# Patient Record
Sex: Female | Born: 2000 | Race: White | Hispanic: No | Marital: Single | Smoking: Never smoker
Health system: Southern US, Community
[De-identification: ages and names within clinical notes are randomized; demographics above are authoritative.]

## PROBLEM LIST (undated history)

## (undated) DIAGNOSIS — F419 Anxiety disorder, unspecified: Secondary | ICD-10-CM

## (undated) DIAGNOSIS — F909 Attention-deficit hyperactivity disorder, unspecified type: Secondary | ICD-10-CM

## (undated) HISTORY — DX: Attention-deficit hyperactivity disorder, unspecified type: F90.9

## (undated) HISTORY — PX: WISDOM TOOTH EXTRACTION: SHX21

## (undated) HISTORY — DX: Anxiety disorder, unspecified: F41.9

## (undated) HISTORY — PX: TONSILLECTOMY: SHX5217

## (undated) HISTORY — PX: NOSE SURGERY: SHX723

---
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
## 2022-05-11 ENCOUNTER — Encounter: Payer: Self-pay | Admitting: Emergency Medicine

## 2022-05-11 ENCOUNTER — Ambulatory Visit
Admission: EM | Admit: 2022-05-11 | Discharge: 2022-05-11 | Disposition: A | Discharge status: Home / Self Care | Payer: 59

## 2022-05-11 DIAGNOSIS — J020 Streptococcal pharyngitis: Secondary | ICD-10-CM

## 2022-05-11 LAB — POCT RAPID STREP A (OFFICE): Rapid Strep A Screen: POSITIVE — AB

## 2022-05-11 MED ORDER — SULFAMETHOXAZOLE-TRIMETHOPRIM 800-160 MG PO TABS: 1.0000 | ORAL_TABLET | Freq: Two times a day (BID) | ORAL | 0 refills | Status: AC

## 2022-05-11 NOTE — Discharge Instructions (Signed)
Strep test is positive for bacteria in the throat  Begin use of Bactrim every morning and every evening for 10 days  May attempt salt water gargles, throat lozenges, warm water plain liquids to preference, softer foods, Tylenol and ibuprofen as needed for pain  Please notify your ear nose and throat doctor of positive test  May follow-up with urgent care as needed

## 2022-05-11 NOTE — ED Triage Notes (Signed)
Patient c/o sore throat and nausea x 1 day.   Patient denies fever at home.   Patient endorses painful swallowing. Patient endorses chills .   Patient endorses issues with reoccurrence of  strep with last diagnosis being earlier this year per patient statement.

## 2022-05-11 NOTE — ED Provider Notes (Signed)
[name]    CSN: [id number] Arrival date & time: 05/11/22  1507      History   Chief Complaint Chief Complaint  Patient presents with   Sore Throat   Nausea    HPI Latoya Rivera is a 21 y.o. female.   Patient presents with sore throat, nausea without vomiting and chills beginning 1 day ago.  No known sick contacts but endorses COVID has been going around her school.  Has not attempted to eat or drink today.  Has not attempted treatment of symptoms.  Endorses reoccurring strep followed by ear nose and throat.  Denies nasal congestion, ear pain, coughing, shortness of breath, wheezing, abdominal pain.  History reviewed. No pertinent past medical history.  There are no problems to display for this patient.   Past Surgical History:  Procedure Laterality Date   NOSE SURGERY     WISDOM TOOTH EXTRACTION Bilateral     OB History   No obstetric history on file.      Home Medications    Prior to Admission medications   Medication Sig Start Date End Date Taking? Authorizing Provider  escitalopram (LEXAPRO) 10 MG tablet Take 10 mg by mouth daily. 05/10/22  Yes [provider]  sulfamethoxazole-trimethoprim (BACTRIM DS) 800-160 MG tablet Take 1 tablet by mouth 2 (two) times daily for 10 days. 05/11/22 05/21/22 Yes Makynzi Eastland, Elita Boone, NP  levonorgestrel (KYLEENA) 19.5 MG IUD 1 each by Intrauterine route once.    [provider]  omeprazole (PRILOSEC OTC) 20 MG tablet Take 1 tablet (20 mg total) by mouth daily. 07/03/21 08/02/21  Georga Hacking, MD  sertraline (ZOLOFT) 50 MG tablet Take 1 tablet by mouth daily. 04/13/21 05/13/21  [provider]  triamcinolone (KENALOG) 0.025 % ointment Apply 1 application topically 2 (two) times daily. 05/01/21   Bing Neighbors, FNP    Family History History reviewed. No pertinent family history.  Social History Social History   Tobacco Use   Smoking status: Never   Smokeless tobacco: Never   Substance Use Topics   Alcohol use: Never   Drug use: Never     Allergies   Other, Meningococcal grp b (recomb) vaccine, and Penicillins   Review of Systems Review of Systems Defer to HPI   Physical Exam Triage Vital Signs ED Triage Vitals  Enc Vitals Group     BP 05/11/22 1521 99/63     Pulse Rate 05/11/22 1521 100     Resp 05/11/22 1521 16     Temp 05/11/22 1521 99.3 F (37.4 C)     Temp Source 05/11/22 1521 Oral     SpO2 05/11/22 1521 98 %     Weight --      Height --      Head Circumference --      Peak Flow --      Pain Score 05/11/22 1518 7     Pain Loc --      Pain Edu? --      Excl. in GC? --    No data found.  Updated Vital Signs BP 99/63 (BP Location: Left Arm)   Pulse 100   Temp 99.3 F (37.4 C) (Oral)   Resp 16   LMP  (LMP Unknown)   SpO2 98%   Visual Acuity Right Eye Distance:   Left Eye Distance:   Bilateral Distance:    Right Eye Near:   Left Eye Near:    Bilateral Near:     Physical  Exam Constitutional:      Appearance: She is well-developed.  HENT:     Head: Normocephalic.     Right Ear: Tympanic membrane and ear canal normal.     Left Ear: Tympanic membrane and ear canal normal.     Nose: No congestion or rhinorrhea.     Mouth/Throat:     Mouth: Mucous membranes are moist.     Pharynx: No posterior oropharyngeal erythema.     Tonsils: No tonsillar exudate. 2+ on the right. 2+ on the left.  Cardiovascular:     Rate and Rhythm: Normal rate and regular rhythm.     Heart sounds: Normal heart sounds.  Pulmonary:     Effort: Pulmonary effort is normal.     Breath sounds: Normal breath sounds.  Musculoskeletal:     Cervical back: Normal range of motion and neck supple.  Skin:    General: Skin is warm and dry.  Neurological:     General: No focal deficit present.     Mental Status: She is alert and oriented to person, place, and time.  Psychiatric:        Mood and Affect: Mood normal.      UC Treatments / Results   Labs (all labs ordered are listed, but only abnormal results are displayed) Labs Reviewed  POCT RAPID STREP A (OFFICE) - Abnormal; Notable for the following components:      Result Value   Rapid Strep A Screen Positive (*)    All other components within normal limits    EKG   Radiology No results found.  Procedures Procedures (including critical care time)  Medications Ordered in UC Medications - No data to display  Initial Impression / Assessment and Plan / UC Course  I have reviewed the triage vital signs and the nursing notes.  Pertinent labs & imaging results that were available during my care of the patient were reviewed by me and considered in my medical decision making (see chart for details).  Strep pharyngitis  Confirmed by PCR, Bactrim 10-day course prescribed, allergy to penicillin confirmed, recommended supportive care for additional advised patient to notify ENT test results as she endorsed the next step is a tonsillectomy, may follow-up with urgent care as needed Final Clinical Impressions(s) / UC Diagnoses   Final diagnoses:  Strep pharyngitis     Discharge Instructions      Strep test is positive for bacteria in the throat  Begin use of Bactrim every morning and every evening for 10 days  May attempt salt water gargles, throat lozenges, warm water plain liquids to preference, softer foods, Tylenol and ibuprofen as needed for pain  Please notify your ear nose and throat doctor of positive test  May follow-up with urgent care as needed   ED Prescriptions     Medication Sig Dispense Auth. Provider   sulfamethoxazole-trimethoprim (BACTRIM DS) 800-160 MG tablet Take 1 tablet by mouth 2 (two) times daily for 10 days. 20 tablet Valinda Hoar, NP      PDMP not reviewed this encounter.   Valinda Hoar, NP 05/11/22 1538

## 2022-06-03 ENCOUNTER — Encounter: Payer: Self-pay | Admitting: Internal Medicine

## 2022-06-03 ENCOUNTER — Other Ambulatory Visit: Payer: Self-pay

## 2022-06-03 ENCOUNTER — Ambulatory Visit (INDEPENDENT_AMBULATORY_CARE_PROVIDER_SITE_OTHER): Payer: 59 | Admitting: Internal Medicine

## 2022-06-03 VITALS — BP 108/64 | HR 74 | Temp 97.7°F | Resp 18 | Ht 65.0 in | Wt 138.6 lb

## 2022-06-03 DIAGNOSIS — R140 Abdominal distension (gaseous): Secondary | ICD-10-CM

## 2022-06-03 DIAGNOSIS — Z880 Allergy status to penicillin: Secondary | ICD-10-CM

## 2022-06-03 DIAGNOSIS — R1084 Generalized abdominal pain: Secondary | ICD-10-CM | POA: Diagnosis not present

## 2022-06-03 DIAGNOSIS — J310 Chronic rhinitis: Secondary | ICD-10-CM | POA: Diagnosis not present

## 2022-06-03 DIAGNOSIS — R1013 Epigastric pain: Secondary | ICD-10-CM | POA: Diagnosis not present

## 2022-06-03 MED ORDER — FLUTICASONE PROPIONATE 50 MCG/ACT NA SUSP: 2.0000 | Freq: Every day | NASAL | 5 refills | Status: DC

## 2022-06-03 NOTE — Progress Notes (Signed)
NEW PATIENT  Date of Service/Encounter:  06/03/22  Consult requested by: Patient, No Pcp Per   Subjective:   Latoya Rivera (DOB: 01-06-2001) is a 21 y.o. female who presents to the clinic on 06/03/2022 with a chief complaint of Food Intolerance and Allergy Testing .    History obtained from: chart review and patient.  Concern for food allergy: Reports over the past 1 to 1-1/2-year, she has had abdominal pain, bloating, nausea.  Denies weight loss or vomiting or diarrhea.  She has been followed by GI, Lethea Killings NP.  They are considering IBS, gastritis, biliary causes as differential diagnosis.  She has undergone testing for celiac and h pylori through blood work and it was negative.  She has never undergone EGD or colonoscopy.  She has noted that she is sensitive to gluten and tries to avoid it as much as possible.  She has also removed dairy for about 6 weeks and replaced it with almond and oat milk without any improvement.  She does try to avoid eating fatty and sugary foods but it is difficult while being in college.  She eats chicken, salads, fruits, vegetables, seafood, quesadillas, chicken nuggets etc. She also does drink alcohol on the weekends mostly fruity mixed drinks but avoids beer.  Rhinitis:  Started since childhood Symptoms include: nasal congestion, rhinorrhea, post nasal drainage, and sneezing  Occurs seasonally-Spring and Fall Potential triggers: pollen Treatments tried: Zyrtec or Claritin PRN. She has tried nose sprays in the past but can't recall the names.  These do help. Previous allergy testing: no History of chronic sinusitis or sinus surgery: yes; septoplasty.  About to undergo tonsillectomy and adenoidectomy next week and then is going to go home to [place]   Concern for Drug Allergy: Drug of concern: Amoxicillin in childhood History of reaction: hives only, does not recall the exact history Previous testing: none Carries an epinephrine  autoinjector: no  Past Medical History: History reviewed. No pertinent past medical history.  Past Surgical History: Past Surgical History:  Procedure Laterality Date   NOSE SURGERY     WISDOM TOOTH EXTRACTION Bilateral     Family History: History reviewed. No pertinent family history. ADHD Anxiety Depression  Social History:  Lives in a unknown year apartment [place] in bedroom: wood Pets: dog Tobacco use/exposure: yes vapes and smokes cigarettes Job: Sophomore at [employer]  Medication List:  Allergies as of 06/03/2022       Reactions   Gluten Meal    Other    Seasonal   Meningococcal Group B Vaccine Rash   Swelling/redness at the site. Hives on belly (? related to MenB#1) Did well for the second Men B vaccine. No reaction, Swelling/redness at the site. Hives on belly (? related to MenB#1)   Meningococcal Grp B (recomb) Vaccine Rash   Swelling/redness at the site. Hives on belly (? related to MenB#1) Did well for the second Men B vaccine. No reaction, Swelling/redness at the site. Hives on belly (? related to MenB#1)   Penicillins Hives, Other (See Comments), Rash   Other reaction(s): Unknown Other reaction(s): Unknown        Medication List        Accurate as of June 03, 2022 11:01 AM. If you have any questions, ask your nurse or doctor.          clindamycin 300 MG capsule Commonly known as: CLEOCIN Take 300 mg by mouth 3 (three) times daily.   Culturelle Caps Take by mouth.  escitalopram 10 MG tablet Commonly known as: LEXAPRO Take 10 mg by mouth daily.   fluticasone 50 MCG/ACT nasal spray Commonly known as: FLONASE Place 2 sprays into both nostrils daily. Started by: Birder Robson, MD   Kyleena 19.5 MG IUD Generic drug: levonorgestrel 1 each by Intrauterine route once.   melatonin 3 MG Tabs tablet Take by mouth.   methylphenidate 18 MG CR tablet Commonly known as: CONCERTA Take 18 mg by mouth every morning.    omeprazole 20 MG tablet Commonly known as: PriLOSEC OTC Take 1 tablet (20 mg total) by mouth daily.   sertraline 50 MG tablet Commonly known as: ZOLOFT Take 1 tablet by mouth daily.   triamcinolone 0.025 % ointment Commonly known as: KENALOG Apply 1 application topically 2 (two) times daily.         REVIEW OF SYSTEMS: Pertinent positives and negatives discussed in HPI.   Objective:   Physical Exam: BP 108/64   Pulse 74   Temp 97.7 F (36.5 C) (Temporal)   Resp 18   Ht 5\' 5"  (1.651 m)   Wt 138 lb 9.6 oz (62.9 kg)   LMP  (LMP Unknown)   SpO2 98%   BMI 23.06 kg/m  Body mass index is 23.06 kg/m. GEN: alert, well developed HEENT: clear conjunctiva, TM grey and translucent, nose with + inferior turbinate hypertrophy, pale nasal mucosa, slight clear rhinorrhea, + cobblestoning HEART: regular rate and rhythm, no murmur LUNGS: clear to auscultation bilaterally, no coughing, unlabored respiration ABDOMEN: soft, non distended  SKIN: no rashes or lesions  Reviewed:  GI notes   Assessment:   1. Chronic rhinitis   2. Dyspepsia   3. Bloating   4. Generalized abdominal pain     Plan/Recommendations:  Food Intolerance - Symptoms not consistent with IgE mediated reactions.  We can perform limited skin prick testing to foods and can discuss further based on foods.  Also warned regarding high false positive rates for testing.   - Try to limit intake of alcohol and foods with high sugar/fat.  Chronic Rhinitis - Use nasal saline rinses before nose sprays such as with Neilmed Sinus Rinse.  Use distilled water.   - Use Flonase 1-2 sprays each nostril daily. Aim upward and outward. - Hold all anti histamine for 1 week prior to next visit for skin testing.    Hx of Amoxicillin Allergy - Based on history/PENFAST score, she is low risk for an allergy. - Recommend direct oral challenge to Augmentin.  She will consider this.     Return in about 18 days (around  06/21/2022).  06/23/2022, MD [facility]

## 2022-06-03 NOTE — Patient Instructions (Addendum)
Food Intolerance - We can discuss what we will do based on your lab results.   - Try to limit intake alcohol.    Rhinitis: - Use nasal saline rinses before nose sprays such as with Neilmed Sinus Rinse.  Use distilled water.   - Use Flonase 1-2 sprays each nostril daily. Aim upward and outward. - Hold all anti histamine for 1 week prior to next visit.

## 2022-06-22 ENCOUNTER — Ambulatory Visit: Payer: 59 | Admitting: Family Medicine

## 2022-08-11 ENCOUNTER — Ambulatory Visit: Payer: 59 | Admitting: Family Medicine

## 2022-09-13 NOTE — Progress Notes (Unsigned)
Follow Up Note  RE: Latoya Rivera MRN: [id number] DOB: [birth year] Date of Office Visit: 09/14/2022  Referring provider: Lorelle Formosa, MD Primary care provider: Lorelle Formosa, MD  Chief Complaint: No chief complaint on file.  History of Present Illness: I had the pleasure of seeing Latoya Rivera for a follow up visit at the [facility] on 09/13/2022. She is a 22 y.o. female, who is being followed for food intolerance, chronic rhinitis and history of amoxicillin allergy. Her previous allergy office visit was on 06/03/2022 with Dr. Posey Pronto. Today is a skin testing and follow up visit.  Food Intolerance - Symptoms not consistent with IgE mediated reactions.  We can perform limited skin prick testing to foods and can discuss further based on foods.  Also warned regarding high false positive rates for testing.   - Try to limit intake of alcohol and foods with high sugar/fat.   Chronic Rhinitis - Use nasal saline rinses before nose sprays such as with Neilmed Sinus Rinse.  Use distilled water.   - Use Flonase 1-2 sprays each nostril daily. Aim upward and outward. - Hold all anti histamine for 1 week prior to next visit for skin testing.     Hx of Amoxicillin Allergy - Based on history/PENFAST score, she is low risk for an allergy. - Recommend direct oral challenge to Augmentin.  She will consider this.   Assessment and Plan: Latoya Rivera is a 22 y.o. female with: No problem-specific Assessment & Plan notes found for this encounter.  No follow-ups on file.  No orders of the defined types were placed in this encounter.  Lab Orders  No laboratory test(s) ordered today    Diagnostics: Spirometry:  Tracings reviewed. Her effort: {Blank single:19197::"Good reproducible efforts.","It was hard to get consistent efforts and there is a question as to whether this reflects a maximal maneuver.","Poor effort, data can not be interpreted."} FVC: ***L FEV1: ***L, ***%  predicted FEV1/FVC ratio: ***% Interpretation: {Blank single:19197::"Spirometry consistent with mild obstructive disease","Spirometry consistent with moderate obstructive disease","Spirometry consistent with severe obstructive disease","Spirometry consistent with possible restrictive disease","Spirometry consistent with mixed obstructive and restrictive disease","Spirometry uninterpretable due to technique","Spirometry consistent with normal pattern","No overt abnormalities noted given today's efforts"}.  Please see scanned spirometry results for details.  Skin Testing: {Blank single:19197::"Select foods","Environmental allergy panel","Environmental allergy panel and select foods","Food allergy panel","None","Deferred due to recent antihistamines use"}. *** Results discussed with patient/family.   Medication List:  Current Outpatient Medications  Medication Sig Dispense Refill  . clindamycin (CLEOCIN) 300 MG capsule Take 300 mg by mouth 3 (three) times daily.    Marland Kitchen escitalopram (LEXAPRO) 10 MG tablet Take 10 mg by mouth daily.    . fluticasone (FLONASE) 50 MCG/ACT nasal spray Place 2 sprays into both nostrils daily. 16 g 5  . Lactobacillus Rhamnosus, GG, (CULTURELLE) CAPS Take by mouth.    . levonorgestrel (KYLEENA) 19.5 MG IUD 1 each by Intrauterine route once. (Patient not taking: Reported on 06/03/2022)    . melatonin 3 MG TABS tablet Take by mouth.    . methylphenidate 18 MG PO CR tablet Take 18 mg by mouth every morning.    Marland Kitchen omeprazole (PRILOSEC OTC) 20 MG tablet Take 1 tablet (20 mg total) by mouth daily. 30 tablet 0  . sertraline (ZOLOFT) 50 MG tablet Take 1 tablet by mouth daily.    Marland Kitchen triamcinolone (KENALOG) 0.025 % ointment Apply 1 application topically 2 (two) times daily. (Patient not taking: Reported on 06/03/2022) 80 g  0   No current facility-administered medications for this visit.   Allergies: Allergies  Allergen Reactions  . Gluten Meal   . Other     Seasonal  .  Meningococcal Group B Vaccine Rash    Swelling/redness at the site. Hives on belly (? related to MenB#1) Did well for the second Men B vaccine. No reaction, Swelling/redness at the site. Hives on belly (? related to MenB#1)  . Meningococcal Grp B (Recomb) Vaccine Rash    Swelling/redness at the site. Hives on belly (? related to MenB#1) Did well for the second Men B vaccine. No reaction, Swelling/redness at the site. Hives on belly (? related to MenB#1)   . Penicillins Hives, Other (See Comments) and Rash    Other reaction(s): Unknown Other reaction(s): Unknown    I reviewed her past medical history, social history, family history, and environmental history and no significant changes have been reported from her previous visit.  Review of Systems  Constitutional:  Negative for appetite change, chills, fever and unexpected weight change.  HENT:  Negative for congestion and rhinorrhea.   Eyes:  Negative for itching.  Respiratory:  Negative for cough, chest tightness, shortness of breath and wheezing.   Gastrointestinal:  Negative for abdominal pain.  Skin:  Negative for rash.  Neurological:  Negative for headaches.   Objective: There were no vitals taken for this visit. There is no height or weight on file to calculate BMI. Physical Exam Vitals and nursing note reviewed.  Constitutional:      Appearance: Normal appearance. She is well-developed.  HENT:     Head: Normocephalic and atraumatic.     Right Ear: Tympanic membrane and external ear normal.     Left Ear: Tympanic membrane and external ear normal.     Nose: Nose normal.     Mouth/Throat:     Mouth: Mucous membranes are moist.     Pharynx: Oropharynx is clear.  Eyes:     Conjunctiva/sclera: Conjunctivae normal.  Cardiovascular:     Rate and Rhythm: Normal rate and regular rhythm.     Heart sounds: Normal heart sounds. No murmur heard. Pulmonary:     Effort: Pulmonary effort is normal.     Breath sounds: Normal breath  sounds. No wheezing, rhonchi or rales.  Musculoskeletal:     Cervical back: Neck supple.  Skin:    General: Skin is warm.     Findings: No rash.  Neurological:     Mental Status: She is alert and oriented to person, place, and time.  Psychiatric:        Behavior: Behavior normal.  Previous notes and tests were reviewed. The plan was reviewed with the patient/family, and all questions/concerned were addressed.  It was my pleasure to see Latoya Rivera today and participate in her care. Please feel free to contact me with any questions or concerns.  Sincerely,  Wyline Mood, DO Allergy & Immunology  [facility] office: [phone] [facility] office: [phone]

## 2022-09-14 ENCOUNTER — Ambulatory Visit (INDEPENDENT_AMBULATORY_CARE_PROVIDER_SITE_OTHER): Payer: 59 | Admitting: Allergy

## 2022-09-14 ENCOUNTER — Other Ambulatory Visit: Payer: Self-pay

## 2022-09-14 ENCOUNTER — Encounter: Payer: Self-pay | Admitting: Allergy

## 2022-09-14 VITALS — BP 98/62 | HR 62 | Temp 98.3°F | Resp 16 | Ht 65.0 in | Wt 138.3 lb

## 2022-09-14 DIAGNOSIS — K9049 Malabsorption due to intolerance, not elsewhere classified: Secondary | ICD-10-CM | POA: Diagnosis not present

## 2022-09-14 DIAGNOSIS — J3089 Other allergic rhinitis: Secondary | ICD-10-CM | POA: Diagnosis not present

## 2022-09-14 DIAGNOSIS — Z880 Allergy status to penicillin: Secondary | ICD-10-CM

## 2022-09-14 DIAGNOSIS — Z713 Dietary counseling and surveillance: Secondary | ICD-10-CM

## 2022-09-14 DIAGNOSIS — T781XXD Other adverse food reactions, not elsewhere classified, subsequent encounter: Secondary | ICD-10-CM

## 2022-09-14 NOTE — Assessment & Plan Note (Signed)
Consider penicillin skin testing/drug challenge in the future. More than 90% of patients outgrow their penicillin allergy.

## 2022-09-14 NOTE — Assessment & Plan Note (Signed)
Takes zyrtec prn in the spring with good benefit. No prior allergy testing. Today's skin prick testing negative to indoor/outdoor allergens. Declined further testing as symptoms not bothersome enough.  History suspicious for pollen allergy. See environmental control measures as below. Use over the counter antihistamines such as Zyrtec (cetirizine), Claritin (loratadine), Allegra (fexofenadine), or Xyzal (levocetirizine) daily as needed. May take twice a day during allergy flares. May switch antihistamines every few months. If symptoms worsen will recommend further testing with intradermal testing or bloodwork.

## 2022-09-14 NOTE — Patient Instructions (Addendum)
Today's skin testing showed: Negative to indoor/outdoor allergen and food panel.  Results given.  Food Discussed with patient that skin prick testing and bloodwork (food IgE levels) check for IgE mediated reactions which her clinical presentation does not support.  Keep a food journal with symptoms and foods eaten. Avoid foods that seem bothersome. Gluten. Try lactaid pills or lactose free milk. See handout on FODMAP diet. Follow up with GI.  Rhinitis History suspicious for pollen allergy. See environmental control measures as below. Use over the counter antihistamines such as Zyrtec (cetirizine), Claritin (loratadine), Allegra (fexofenadine), or Xyzal (levocetirizine) daily as needed. May take twice a day during allergy flares. May switch antihistamines every few months.  Penicillin allergy:  Consider penicillin skin testing/drug challenge in the future. If interested we can schedule drug challenge to penicillin. You must be off antihistamines for 3-5 days before. Must be in good health and not ill. Plan on being in the office for 2-3 hours and must bring in the drug you want to do the oral challenge for - will send in prescription to pick up a few days before. You must call to schedule an appointment and specify it's for a drug challenge.  More than 90% of patients outgrow their penicillin allergy.   Follow up if needed.  Reducing Pollen Exposure Pollen seasons: trees (spring), grass (summer) and ragweed/weeds (fall). Keep windows closed in your home and car to lower pollen exposure.  Install air conditioning in the bedroom and throughout the house if possible.  Avoid going out in dry windy days - especially early morning. Pollen counts are highest between 5 - 10 AM and on dry, hot and windy days.  Save outside activities for late afternoon or after a heavy rain, when pollen levels are lower.  Avoid mowing of grass if you have grass pollen allergy. Be aware that pollen can also be  transported indoors on people and pets.  Dry your clothes in an automatic dryer rather than hanging them outside where they might collect pollen.  Rinse hair and eyes before bedtime.

## 2022-09-14 NOTE — Assessment & Plan Note (Signed)
Patient concerned about food allergies. Noted abdominal pain and bloating after eating. Follows with GI. Linzess ineffective. Eliminated gluten from diet with some benefit but still having 1-2 episodes per week. Denies any other associated symptoms. Discussed with patient that skin prick testing and bloodwork (food IgE levels) check for IgE mediated reactions which her clinical presentation does not support. She would still like to undergo skin testing to rule things out.  Today's skin prick testing showed: negative to food panel.  Keep a food journal with symptoms and foods eaten. Avoid foods that seem bothersome. Gluten. Try lactaid pills or lactose free milk. See handout on FODMAP diet. Follow up with GI.

## 2023-03-10 IMAGING — US US ABDOMEN COMPLETE
1 series · 14 of 25 positions shown · non-contrast
Comparison: None.

CLINICAL DATA: 20-year-old female with abdominal tenderness

EXAM:
ABDOMEN ULTRASOUND COMPLETE

[Series 1: us abdomen complete · 0.20mm/px · 14 of 95 slices shown]
[im 1/95]
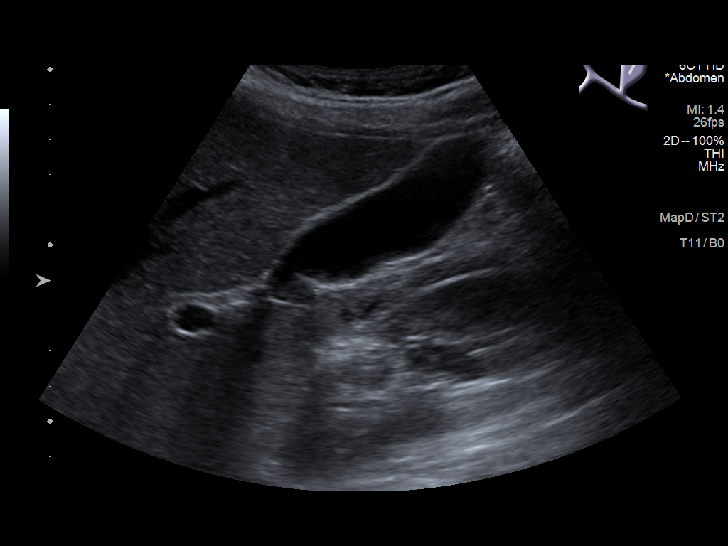
[im 8/95]
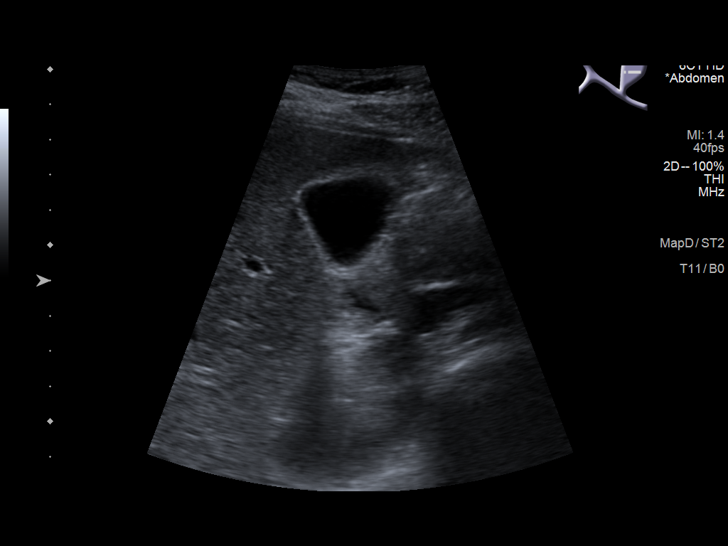
[im 16/95]
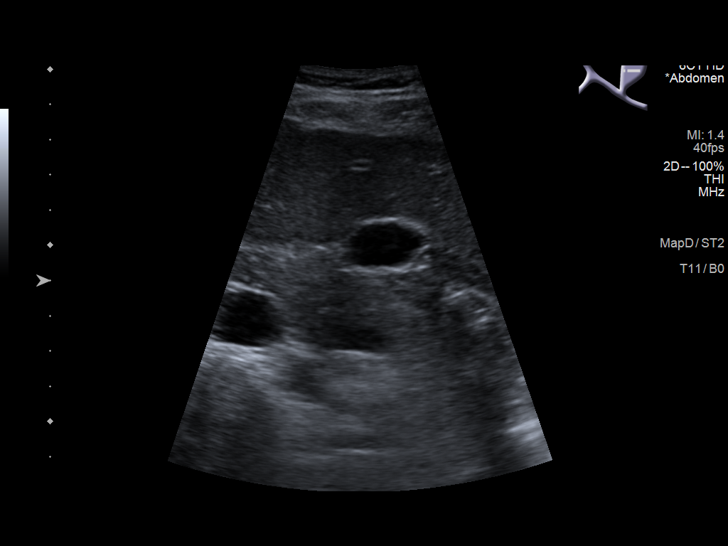
[im 24/95]
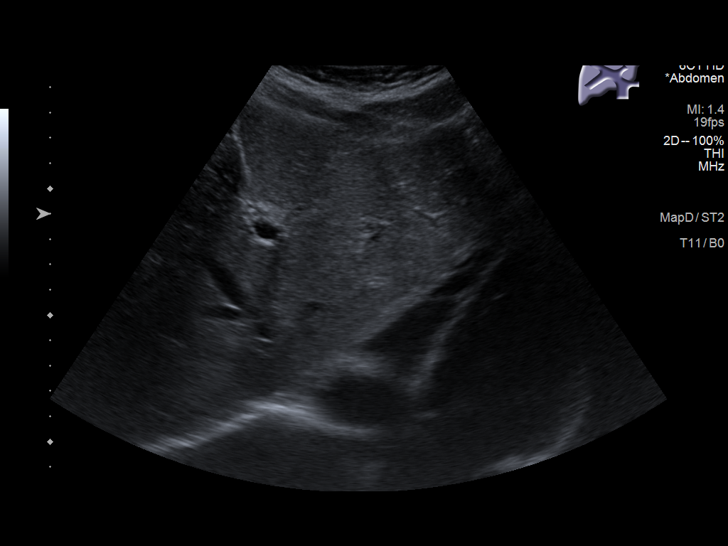
[im 32/95]
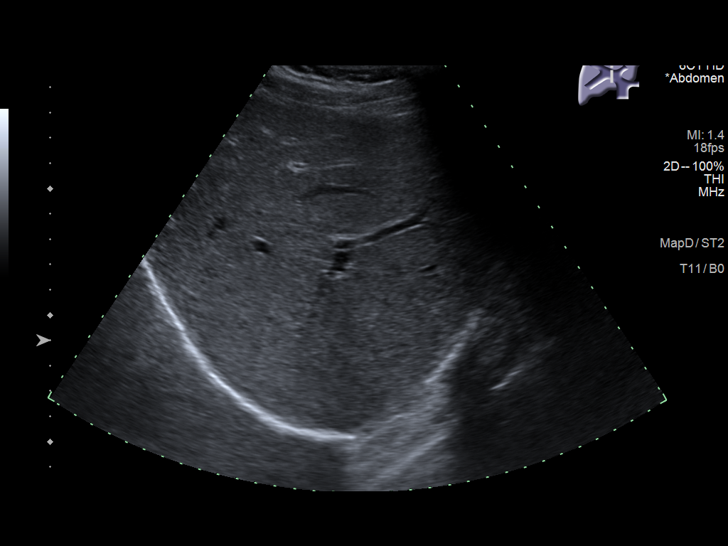
[im 36/95]
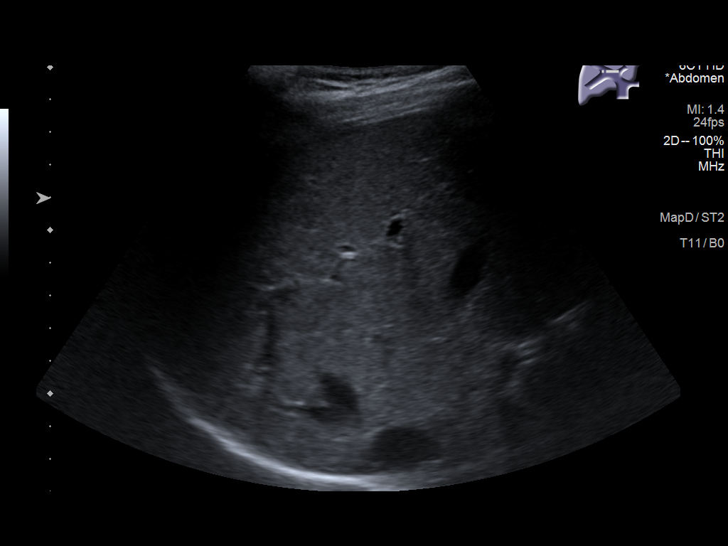
[im 44/95]
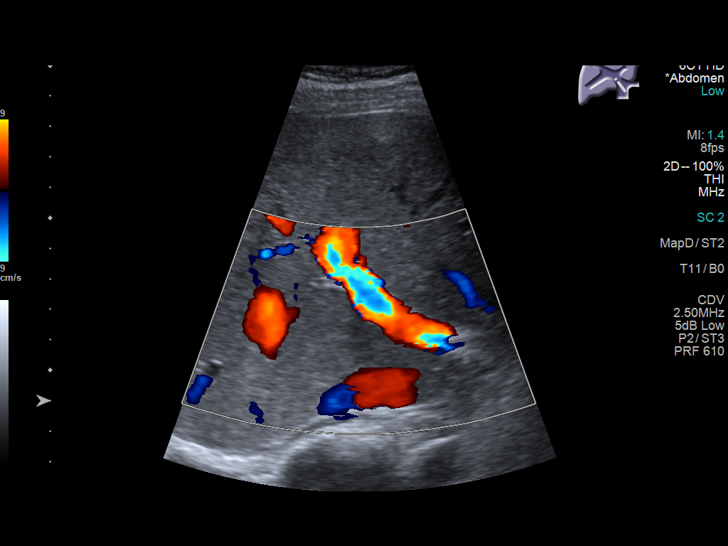
[im 51/95]
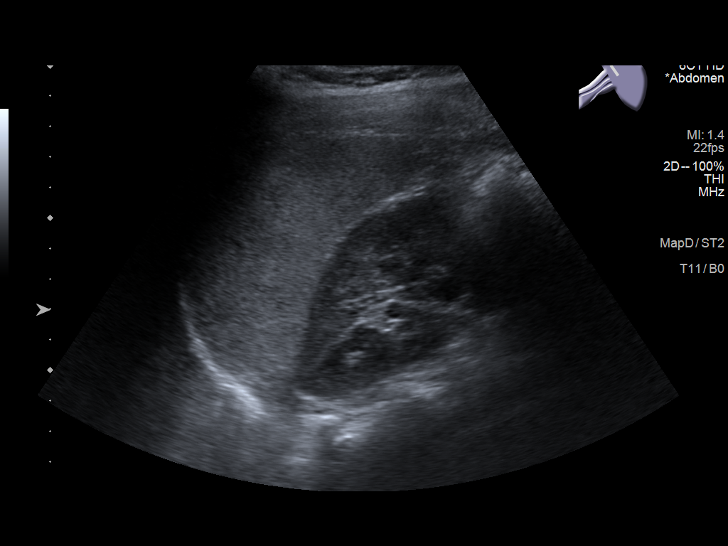
[im 59/95]
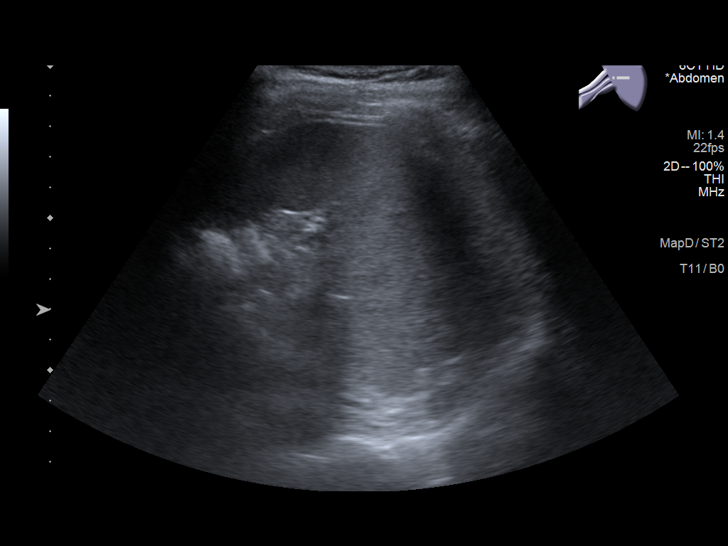
[im 63/95]
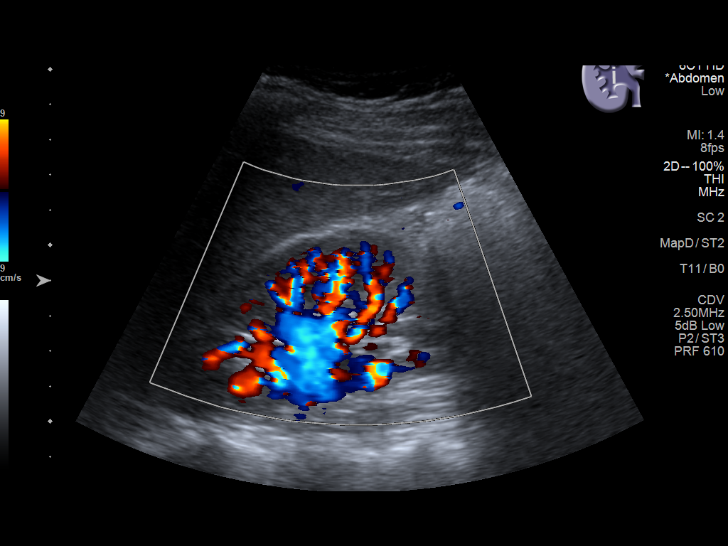
[im 71/95]
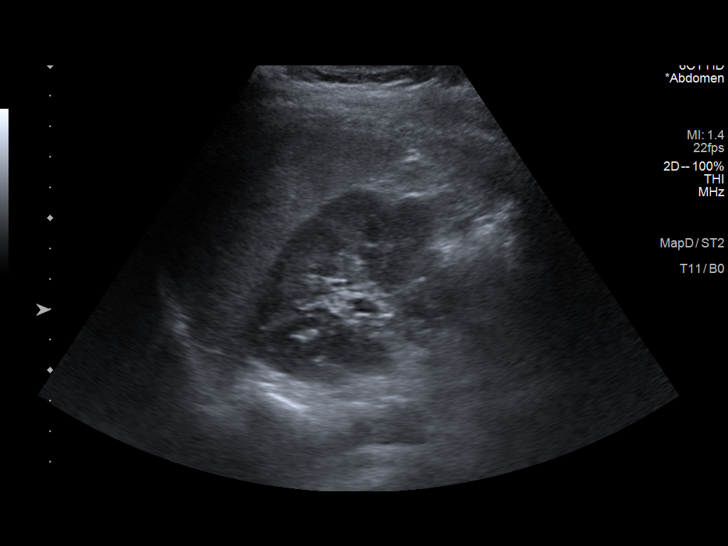
[im 79/95]
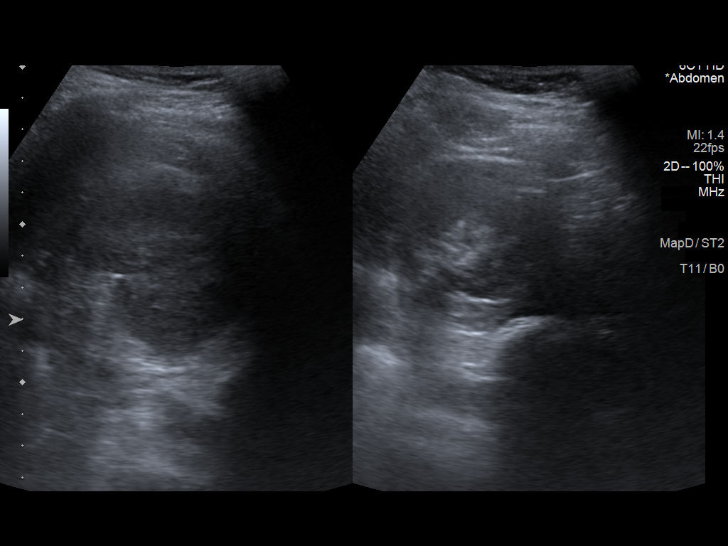
[im 87/95]
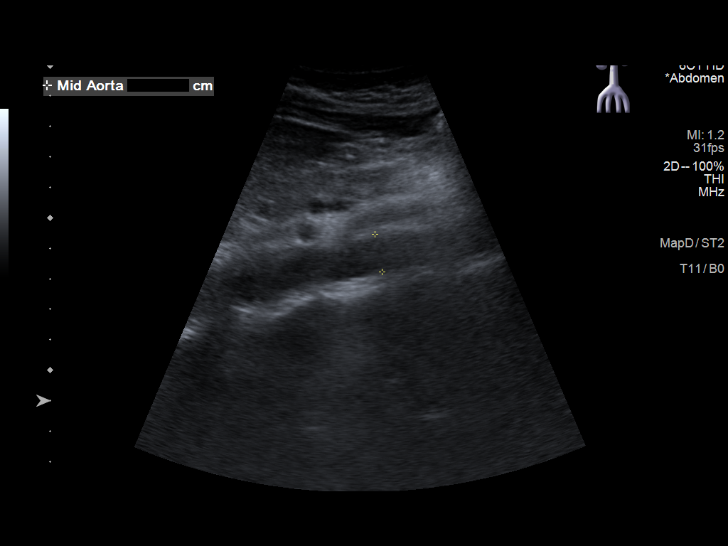
[im 95/95]
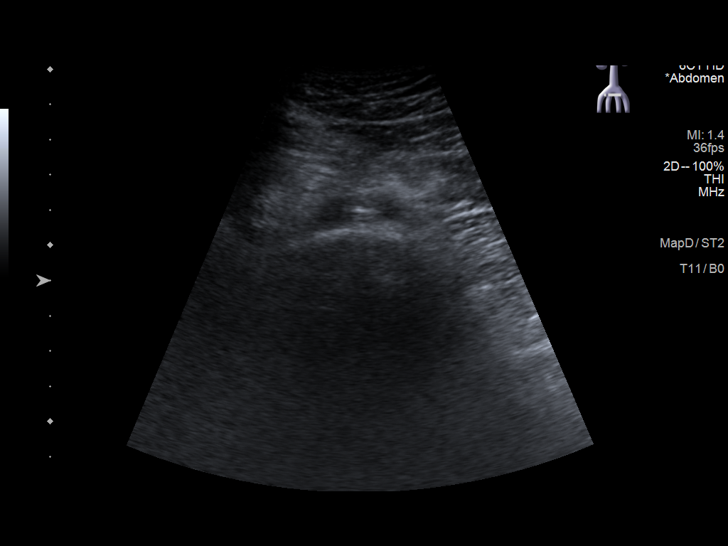

[14 of 25 positions shown; findings below may reference images not displayed]

FINDINGS: Gallbladder: No gallstones or wall thickening visualized. No
sonographic Murphy sign noted by sonographer.

Common bile duct: Diameter: 2 mm-3 mm

Liver: No focal lesion identified. Within normal limits in
parenchymal echogenicity.

IVC: No abnormality visualized.

Pancreas: Visualized portion unremarkable.

Spleen: Size and appearance within normal limits.

Right Kidney: Length: 10.7 cm. Echogenicity within normal limits. No
mass or hydronephrosis visualized.

Left Kidney: Length: 10.8 cm. Echogenicity within normal limits. No
mass or hydronephrosis visualized.

Abdominal aorta: No aneurysm visualized.

Other findings: None.
IMPRESSION: Negative sonographic survey of the abdomen.

## 2023-10-06 ENCOUNTER — Ambulatory Visit (INDEPENDENT_AMBULATORY_CARE_PROVIDER_SITE_OTHER): Payer: 59 | Admitting: Medical

## 2023-10-06 ENCOUNTER — Encounter: Payer: Self-pay | Admitting: Medical

## 2023-10-06 ENCOUNTER — Other Ambulatory Visit: Payer: Self-pay

## 2023-10-06 VITALS — BP 108/61 | HR 101 | Temp 98.2°F | Ht 64.57 in | Wt 143.0 lb

## 2023-10-06 DIAGNOSIS — R509 Fever, unspecified: Secondary | ICD-10-CM | POA: Diagnosis not present

## 2023-10-06 DIAGNOSIS — J029 Acute pharyngitis, unspecified: Secondary | ICD-10-CM | POA: Diagnosis not present

## 2023-10-06 LAB — POC SOFIA 2 FLU + SARS ANTIGEN FIA
Influenza A, POC: NEGATIVE
Influenza B, POC: NEGATIVE
SARS Coronavirus 2 Ag: NEGATIVE

## 2023-10-06 LAB — POCT RAPID STREP A (OFFICE): Rapid Strep A Screen: NEGATIVE

## 2023-10-06 NOTE — Progress Notes (Signed)
[facility] 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  Noted mild sore throat 4 days ago. Voice became a little hoarse. Felt worse 2 days ago. Developed fever of 103, chills. Fever yesterday 101-102, continued chills and HA. Has also had dry cough. Has felt lightheaded at times. Sore throat more persistent now, some painful to swallow. No nasal congestion or runny nose. Mild nausea, no vomiting or diarrhea. Has had HA and some myalgias. Also fatigued.   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 Had flu shot. No known sick contacts.  Taking Nyquil and Advil. No meds yet today.    Current Medication:  Outpatient Encounter Medications as of 10/06/2023  Medication Sig   amphetamine-dextroamphetamine (ADDERALL) 5 MG tablet Take 1 tablet by mouth daily.   escitalopram (LEXAPRO) 10 MG tablet Take 10 mg by mouth daily.   levonorgestrel (KYLEENA) 19.5 MG IUD 1 each by Intrauterine route once.   LORazepam (ATIVAN) 1 MG tablet Take 1 mg by mouth as needed.   melatonin 3 MG TABS tablet Take by mouth.   ondansetron (ZOFRAN-ODT) 4 MG disintegrating tablet Take 4 mg by mouth once.   [DISCONTINUED] fluticasone (FLONASE) 50 MCG/ACT nasal spray Place 2 sprays into both nostrils daily. (Patient not taking: Reported on 10/06/2023)   [DISCONTINUED] methylphenidate (CONCERTA) 18 MG PO CR tablet Take 18 mg by mouth daily as needed. (Patient not taking: Reported on 10/06/2023)   No facility-administered encounter medications on file as of 10/06/2023.      Medical History: Past Medical  History:  Diagnosis Date   ADHD (attention deficit hyperactivity disorder)    Anxiety      Vital Signs: BP 108/61   Pulse (!) 101   Temp 98.2 F (36.8 C) (Tympanic)   Ht 5' 4.57" (1.64 m)   Wt 143 lb (64.9 kg)   SpO2 98%   BMI 24.12 kg/m    Review of Systems See HPI  Physical Exam Vitals reviewed.  Constitutional:      General: She is not in acute distress.    Appearance: She is ill-appearing (mildly).  HENT:     Head: Normocephalic.     Right Ear: Ear canal and external ear normal.     Left Ear: Ear canal and external ear normal.     Ears:     Comments: TMs slightly dull    Nose: Mucosal edema (mild) present. No congestion or rhinorrhea.     Mouth/Throat:     Mouth: Mucous membranes are moist. No oral lesions.     Pharynx: Posterior oropharyngeal erythema (mild) present. No pharyngeal swelling.     Comments: Tonsils surgically absent Cardiovascular:     Rate and Rhythm: Regular rhythm. Tachycardia present.     Heart sounds: No murmur heard.    No friction rub. No gallop.  Pulmonary:     Effort: Pulmonary effort is normal.     Breath sounds: Normal breath sounds. No wheezing, rhonchi or rales.  Musculoskeletal:     Cervical back: Neck supple. No rigidity.  Lymphadenopathy:     Cervical: Cervical adenopathy (1+ anterior nodes,  slightly tender) present.  Neurological:     Mental Status: She is alert.    Results for orders placed or performed in visit on 10/06/23 (from the past 24 hours)  POC SOFIA 2 FLU + SARS ANTIGEN FIA     Status: Normal   Collection Time: 10/06/23 11:05 AM  Result Value Ref Range   Influenza A, POC Negative Negative   Influenza B, POC Negative Negative   SARS Coronavirus 2 Ag Negative Negative  POCT rapid strep A     Status: Normal   Collection Time: 10/06/23 11:19 AM  Result Value Ref Range   Rapid Strep A Screen Negative Negative      Assessment/Plan: 1. Fever, unspecified fever cause (Primary) 2. Pharyngitis, unspecified  etiology  - POC SOFIA 2 FLU + SARS ANTIGEN FIA - POCT rapid strep A  Suspect viral infection. Discussed supportive measures and OTC symptomatic treatment.   Patient Instructions  -Rest and stay well hydrated (by drinking water and other liquids). Avoid/limit caffeine. -Take over-the-counter medicines (i.e. Dayquil, Nyquil, Ibuprofen) to help relieve your symptoms. -For your sore throat/cough, use cough drops/throat lozenges, gargle warm salt water and/or drink warm liquids (like tea with honey). -Send MyChart message to provider or schedule return visit as needed for new/worsening symptoms or if symptoms do not improve as discussed with recommended treatment over next 4-5 days.       General Counseling: Arcenia has been encouraged to call the office with any questions or concerns that should arise related to todays visit.    Time spent:20 Minutes; visit coded for established patient.  Patient has been seen at our clinic within last 3 years, documented on different EHR.    Jonathon Resides PA-C [employer] 10/06/2023 10:58 AM

## 2023-10-06 NOTE — Patient Instructions (Signed)
-  Rest and stay well hydrated (by drinking water and other liquids). Avoid/limit caffeine. -Take over-the-counter medicines (i.e. Dayquil, Nyquil, Ibuprofen) to help relieve your symptoms. -For your sore throat/cough, use cough drops/throat lozenges, gargle warm salt water and/or drink warm liquids (like tea with honey). -Send MyChart message to provider or schedule return visit as needed for new/worsening symptoms or if symptoms do not improve as discussed with recommended treatment over next 4-5 days.

## 2023-10-24 ENCOUNTER — Ambulatory Visit (INDEPENDENT_AMBULATORY_CARE_PROVIDER_SITE_OTHER): Payer: 59 | Admitting: Physician Assistant

## 2023-10-24 VITALS — BP 108/68 | HR 114 | Temp 98.2°F | Ht 65.0 in | Wt 146.0 lb

## 2023-10-24 DIAGNOSIS — J189 Pneumonia, unspecified organism: Secondary | ICD-10-CM

## 2023-10-24 DIAGNOSIS — J069 Acute upper respiratory infection, unspecified: Secondary | ICD-10-CM

## 2023-10-24 LAB — POC COVID19/FLU A&B COMBO
Covid Antigen, POC: NEGATIVE
Influenza A Antigen, POC: NEGATIVE
Influenza B Antigen, POC: NEGATIVE

## 2023-10-24 MED ORDER — AZITHROMYCIN 250 MG PO TABS: ORAL_TABLET | ORAL | 0 refills | Status: AC

## 2023-10-24 MED ORDER — PSEUDOEPH-BROMPHEN-DM 30-2-10 MG/5ML PO SYRP
10.0000 mL | ORAL_SOLUTION | ORAL | 0 refills | Status: DC | PRN
Start: 2023-10-24 — End: 2024-05-20

## 2023-10-24 NOTE — Progress Notes (Signed)
 [facility] [address] [place], [place] [address] Phone: [phone] Fax: [phone]   Office Visit Note  Patient Name: Latoya Rivera  Date of [id number]  Med Rec number [id number]  Date of Service: 10/24/2023  Gluten meal, Other, Meningococcal group b vaccine, Meningococcal grp b (recomb) vaccine, and Penicillins  No chief complaint on file.    Cough   The patient presents with a history of illness starting on 1/31, for which she was seen at our clinic. While her symptoms initially seemed to improve, they have worsened over the past few days. She reports that her cough is keeping her up at night, and despite trying Delsym, cough drops, and tea, her symptoms persist. Additionally, she continues to experience headaches and facial pressure. Yesterday, she developed a fever of 100F. While there was a brief period where she felt slightly better, her condition has since deteriorated.   Current Medication:  Outpatient Encounter Medications as of 10/24/2023  Medication Sig   amphetamine-dextroamphetamine (ADDERALL) 5 MG tablet Take 1 tablet by mouth daily.   escitalopram (LEXAPRO) 10 MG tablet Take 10 mg by mouth daily.   levonorgestrel (KYLEENA) 19.5 MG IUD 1 each by Intrauterine route once.   LORazepam (ATIVAN) 1 MG tablet Take 1 mg by mouth as needed.   melatonin 3 MG TABS tablet Take by mouth.   ondansetron (ZOFRAN-ODT) 4 MG disintegrating tablet Take 4 mg by mouth once.   No facility-administered encounter medications on file as of 10/24/2023.      Medical History: Past Medical History:  Diagnosis Date   ADHD (attention deficit hyperactivity disorder)    Anxiety      Vital Signs: BP 108/68   Pulse (!) 114   Temp 98.2 F (36.8 C)   Ht 5\' 5"  (1.651 m)   Wt 146 lb (66.2 kg)   SpO2 98%   BMI 24.30 kg/m    Review of Systems  Respiratory:  Positive for cough.     Physical Exam Constitutional:      Appearance: Normal appearance.  HENT:      Head: Normocephalic and atraumatic.     Right Ear: Tympanic membrane normal.     Left Ear: Tympanic membrane normal.     Mouth/Throat:     Mouth: Mucous membranes are moist.     Pharynx: Oropharynx is clear.  Cardiovascular:     Rate and Rhythm: Regular rhythm. Tachycardia present.     Pulses: Normal pulses.     Heart sounds: Normal heart sounds.  Pulmonary:     Effort: Pulmonary effort is normal.     Breath sounds: Normal breath sounds.  Neurological:     Mental Status: She is alert.       Assessment/Plan: The patient initially became ill on 1/31 and continues to experience persistent symptoms, including a cough that disrupts her sleep and the redevelopment of fever. On exam, overall findings are reassuring, although she is tachycardic. Given the prolonged duration of symptoms, the return of fever, and tachycardia in the clinic, we need to consider walking pneumonia, even though breath sounds are normal. POC testing for flu and COVID was negative. I will start her on a Z-pack today and initiate Bromfed to address her other associated symptoms. Precautions were provided. She will need to follow up if symptoms do not improve by the end of the week. The patient is agreeable to the plan.  General Counseling: Latoya Rivera verbalizes understanding of the findings of todays visit and agrees with plan of  treatment. I have discussed any further diagnostic evaluation that may be needed or ordered today. We also reviewed her medications today. she has been encouraged to call the office with any questions or concerns that should arise related to todays visit.   No orders of the defined types were placed in this encounter.   No orders of the defined types were placed in this encounter.   Time spent:20 Minutes Time spent includes review of chart, medications, test results, and follow up plan with the patient.    Leroy Kennedy, PA-C [facility]

## 2023-12-18 ENCOUNTER — Ambulatory Visit: Admitting: Medical

## 2024-05-16 ENCOUNTER — Other Ambulatory Visit: Payer: Self-pay

## 2024-05-16 ENCOUNTER — Emergency Department

## 2024-05-16 ENCOUNTER — Encounter: Payer: Self-pay | Admitting: Emergency Medicine

## 2024-05-16 ENCOUNTER — Emergency Department
Admission: EM | Admit: 2024-05-16 | Discharge: 2024-05-16 | Disposition: A | Discharge status: Home / Self Care | Attending: Emergency Medicine | Admitting: Emergency Medicine

## 2024-05-16 DIAGNOSIS — K5900 Constipation, unspecified: Secondary | ICD-10-CM | POA: Insufficient documentation

## 2024-05-16 LAB — URINALYSIS, ROUTINE W REFLEX MICROSCOPIC
Bilirubin Urine: NEGATIVE
Glucose, UA: NEGATIVE mg/dL
Hgb urine dipstick: NEGATIVE
Ketones, ur: NEGATIVE mg/dL
Leukocytes,Ua: NEGATIVE
Nitrite: NEGATIVE
Protein, ur: NEGATIVE mg/dL
Specific Gravity, Urine: 1.01 (ref 1.005–1.030)
pH: 6 (ref 5.0–8.0)

## 2024-05-16 LAB — POC URINE PREG, ED: Preg Test, Ur: NEGATIVE

## 2024-05-16 MED ORDER — MINERAL OIL RE ENEM
1.0000 | ENEMA | Freq: Once | RECTAL | Status: AC
  Administered 2024-05-16: 1 via RECTAL

## 2024-05-16 MED ORDER — DOCUSATE SODIUM 100 MG PO CAPS: 100.0000 mg | ORAL_CAPSULE | Freq: Every day | ORAL | 2 refills | Status: DC | PRN

## 2024-05-16 MED ORDER — POLYETHYLENE GLYCOL 3350 17 G PO PACK: 17.0000 g | PACK | Freq: Every day | ORAL | 0 refills | Status: AC

## 2024-05-16 MED ORDER — DOCUSATE SODIUM 100 MG PO CAPS: 100.0000 mg | ORAL_CAPSULE | Freq: Every day | ORAL | 2 refills | Status: AC | PRN

## 2024-05-16 MED ORDER — POLYETHYLENE GLYCOL 3350 17 G PO PACK
17.0000 g | PACK | Freq: Every day | ORAL | 0 refills | Status: DC
Start: 2024-05-16 — End: 2024-05-16

## 2024-05-16 NOTE — ED Provider Notes (Signed)
 [facility] Provider Note    Event Date/Time   First MD Initiated Contact with Patient 05/16/24 2027     (approximate)   History   Constipation   HPI  Latoya Rivera is a 23 y.o. female  with a past medical history of IBS, ADHD, anxiety, ovarian cysts presents to the emergency department with constipation x 1 week.  Patient states she has only been able to use the bathroom once and had a small amount of stool come out in the last week.  Reports it is the norm for her to not use the bathroom for 3-4 days at a time with her IBS. Patient also reports bilateral side lower back pain that started today.  Patient states she has tried over-the-counter senna-infused tea, prune juice, sodium enema and increasing movement at home without much relief.  Patient denies any feverm, chills, dysuria, hematuria, nausea, vomiting, dyspnea, chest pain, diarrhea.  No blood in stool.  Endorses pain over lower abdominal region.  Last menstrual period was 1 week ago, has IUD.    Physical Exam   Triage Vital Signs: ED Triage Vitals [05/16/24 1823]  Encounter Vitals Group     BP 128/69     Girls Systolic BP Percentile      Girls Diastolic BP Percentile      Boys Systolic BP Percentile      Boys Diastolic BP Percentile      Pulse Rate 84     Resp 18     Temp 98.4 F (36.9 C)     Temp Source Oral     SpO2 100 %     Weight 130 lb (59 kg)     Height 5' 5 (1.651 m)     Head Circumference      Peak Flow      Pain Score 6     Pain Loc      Pain Education      Exclude from Growth Chart     Most recent vital signs: Vitals:   05/16/24 1823  BP: 128/69  Pulse: 84  Resp: 18  Temp: 98.4 F (36.9 C)  SpO2: 100%    General: Awake, in no acute distress. Appears stated age. Neck: Supple, no lymphadenopathy, no nuchal rigidity. CV: Good peripheral perfusion. No edema.  Respiratory: Normal respiratory effort.  No respiratory distress. CTAB. GI: Hard in RLQ and LLQ but no  TTP, non-distended, non-tender. No rebound or guarding. MSK: Moving all extremities with ease. Skin:Warm, dry, intact. No rashes, lesions, or ecchymosis. No cyanosis or pallor. No CVA tenderness b/l.  ED Results / Procedures / Treatments   Labs (all labs ordered are listed, but only abnormal results are displayed) Labs Reviewed  URINALYSIS, ROUTINE W REFLEX MICROSCOPIC - Abnormal; Notable for the following components:      Result Value   Color, Urine STRAW (*)    APPearance CLEAR (*)    All other components within normal limits  POC URINE PREG, ED     EKG     RADIOLOGY  KUB FINDINGS: The bowel gas pattern is normal. No radio-opaque calculi or other significant radiographic abnormality are seen. Moderate stool burden in the colon. IUD in the pelvis.   IMPRESSION: Moderate stool burden.  No acute findings.   PROCEDURES:  Critical Care performed: No   Procedures   MEDICATIONS ORDERED IN ED: Medications  mineral oil enema 1 enema (has no administration in time range)     IMPRESSION / MDM /  ASSESSMENT AND PLAN / ED COURSE  I reviewed the triage vital signs and the nursing notes.                              Differential diagnosis includes, but is not limited to, constipation, IBS, SBO, UTI  Patient's presentation is most consistent with acute complicated illness / injury requiring diagnostic workup.  Patient is a 23 year old female presenting with constipation x 1 week.  Physical exam is reassuring with no rigidity or tenderness to palpation on abdominal or back exam, no fever or vomiting, no urinary symptoms..  KUB ordered.  I independently viewed the x-ray and radiologist's report.  I agree with the radiologist's report that there is moderate stool burden present.  UA is without any leukocytes, nitrites, WBCs.  Will try her on a mineral oil enema that she would like to take home instead of using here.  Also prescribed MiraLAX  and Colace to help with her pain.   Did discuss with her that these are not working and she may try magnesium  citrate or senna tablets.  All of her vital signs are within normal range.  Patient should follow-up with her PCP as needed or in the emergency department if she is unable to have a successful bowel movement within 24 to 48 hours or for any new, worsening or concerning symptoms.   Patient was given the opportunity to ask questions; all questions were answered. Emergency department return precautions were discussed with the patient.  Patient is in agreement to the treatment plan.  Patient is stable for discharge.   FINAL CLINICAL IMPRESSION(S) / ED DIAGNOSES   Final diagnoses:  Constipation, unspecified constipation type     Rx / DC Orders   ED Discharge Orders          Ordered    polyethylene glycol (MIRALAX ) 17 g packet  Daily        05/16/24 2150    docusate sodium  (COLACE) 100 MG capsule  Daily PRN        05/16/24 2150             Note:  This document was prepared using Dragon voice recognition software and may include unintentional dictation errors.     Sheron Salm, PA-C 05/16/24 2202    Clarine Ozell LABOR, MD 05/20/24 [phone]

## 2024-05-16 NOTE — ED Triage Notes (Signed)
 Patient to ED via [facility] for constipation for over 1 week. Pt reports using over the counter meds with no relief. Also having lower back pain.

## 2024-05-16 NOTE — Discharge Instructions (Addendum)
 You were seen in the emergency department today for constipation.  We recommend that you use one or more of the following over-the-counter medications in the order described:   1)  Miralax (powder):  This medication works by drawing additional fluid into your intestines and helps to flush out your stool.  Mix the powder with water or juice according to label instructions.  Be sure to use the recommended amount of water or juice when you mix up the powder.  Plenty of fluids will help to prevent constipation. 2)  Colace (or Dulcolax) 100 mg:  This is a stool softener, and you may take it once or twice a day as needed. 3)  Senna tablets:  This is a bowel stimulant that will help "push" out your stool. It is the next step to add after you have tried a stool softener. 4)  Magnesium citrate: This is typically sold in a clear glass bottle also purchased without the need for prescription.  You can drink the bottle and it typically stimulates your bowels within a short period of time.  You may also want to consider using glycerin suppositories, which you insert into your rectum.  You hold it in place and is dissolves and softens your stool and stimulates your bowels.  You could also consider using an enema, which is also available over the counter.  Remember that narcotic pain medications are constipating, so avoid them or minimize their use.  Drink plenty of fluids.  Please return to the Emergency Department immediately if you develop new or worsening symptoms that concern you, such as (but not limited to) fever > 101 degrees, severe abdominal pain, or persistent vomiting.

## 2024-05-20 ENCOUNTER — Other Ambulatory Visit: Payer: Self-pay

## 2024-05-20 ENCOUNTER — Encounter: Payer: Self-pay | Admitting: Family Medicine

## 2024-05-20 ENCOUNTER — Ambulatory Visit (INDEPENDENT_AMBULATORY_CARE_PROVIDER_SITE_OTHER): Admitting: Family Medicine

## 2024-05-20 VITALS — BP 120/73 | HR 76 | Temp 97.7°F

## 2024-05-20 DIAGNOSIS — K5900 Constipation, unspecified: Secondary | ICD-10-CM

## 2024-05-20 MED ORDER — LINACLOTIDE 72 MCG PO CAPS
72.0000 ug | ORAL_CAPSULE | Freq: Every day | ORAL | 0 refills | Status: DC
Start: 2024-05-20 — End: 2024-06-12

## 2024-05-20 NOTE — Progress Notes (Signed)
 Assessment & Plan:  Patient ID: Latoya Rivera, female    DOB: 06/06/2001  Age: 23 y.o. MRN: [id number]  Problem List Items Addressed This Visit   None Visit Diagnoses       Constipation, unspecified constipation type    -  Primary   Relevant Medications   linaclotide  (LINZESS ) 72 MCG capsule        Follow-up: Return if symptoms worsen or fail to improve. Rx linzess , start off lower dose of 72 mcg capsules and increase to 142 if no improvement in several days. Use as needed. Push fluids, fibers and peristalsis with walking. Stop some of the suppositores since likely all flushed out near anal/rectal region  Subjective:    CC: The encounter diagnosis was Constipation, unspecified constipation type.  HPI Latoya Rivera is  a pleasant 23 y/o female with a PMH of IBS, ADHD who presents for  a recent ER visit follow up on 05/16/24 for constipation. Has tried smoothjuice, miralax , saline enema, at the time when she was seen in the ER she was not able to pass gas and was having back and lower pelvic pain. She had an adominal xray which showed moderate stool burden, she was discharged and told to use  mineral enema, colace, and also miralax . She is feeling slightly better today, less distended, is passing gas now, some liquid but no actual stool. Tried 2 mineral oil enemas, colace, miralx, magnesium  citrate, senna tablet and glycerin suppository   History Latoya Rivera has a past medical history of ADHD (attention deficit hyperactivity disorder) and Anxiety.   She has a past surgical history that includes Nose surgery; Wisdom tooth extraction (Bilateral); and Tonsillectomy.   Her family history is not on file.She reports that she has never smoked. She has been exposed to tobacco smoke. She has never used smokeless tobacco. She reports current alcohol use. She reports that she does not use drugs.  Outpatient Medications Prior to Visit  Medication Sig Dispense Refill    amphetamine-dextroamphetamine (ADDERALL) 5 MG tablet Take 1 tablet by mouth daily.     docusate sodium  (COLACE) 100 MG capsule Take 1 capsule (100 mg total) by mouth daily as needed. 30 capsule 2   escitalopram (LEXAPRO) 10 MG tablet Take 10 mg by mouth daily.     levonorgestrel (KYLEENA) 19.5 MG IUD 1 each by Intrauterine route once.     LORazepam (ATIVAN) 1 MG tablet Take 1 mg by mouth as needed.     melatonin 3 MG TABS tablet Take by mouth.     polyethylene glycol (MIRALAX ) 17 g packet Take 17 g by mouth daily. 14 each 0   brompheniramine-pseudoephedrine-DM 30-2-10 MG/5ML syrup Take 10 mLs by mouth every 4 (four) hours as needed. 120 mL 0   No facility-administered medications prior to visit.    ROS per HPI Review of Systems  Constitutional:  Negative for chills and fever.  Gastrointestinal:  Positive for constipation. Negative for abdominal pain, blood in stool, nausea and vomiting.    Objective:  BP 120/73   Pulse 76   Temp 97.7 F (36.5 C) (Tympanic)   SpO2 100%   Physical Exam GEN: A&O x 3, NAD, well nourished HEENT: Normocephalic, atraumatic, EOMI, OP patent, bilateral TMs normal, no LAD, no thyromegaly RESP: CTAB, no R/R/W CV: RRR, no M/R/G ABDOMEN: + soft, nontender, normal BS, no HSM VAS: no Latoya Rivera swelling NEURO: A&O x 3 MSK: normal gait, normal ROM UE and Latoya Rivera PSYCH: normal speech, normal mood SKIN: no rashes or  lesions   Latoya Rivera Latoya Rivera Latoya Koch, DO

## 2024-06-12 ENCOUNTER — Encounter: Payer: Self-pay | Admitting: Family Medicine

## 2024-06-12 ENCOUNTER — Other Ambulatory Visit: Payer: Self-pay

## 2024-06-12 ENCOUNTER — Ambulatory Visit (INDEPENDENT_AMBULATORY_CARE_PROVIDER_SITE_OTHER): Admitting: Family Medicine

## 2024-06-12 VITALS — BP 180/64 | HR 72 | Temp 97.0°F | Wt 135.0 lb

## 2024-06-12 DIAGNOSIS — F909 Attention-deficit hyperactivity disorder, unspecified type: Secondary | ICD-10-CM

## 2024-06-12 MED ORDER — AMPHETAMINE-DEXTROAMPHETAMINE 5 MG PO TABS: 1.0000 | ORAL_TABLET | Freq: Every day | ORAL | 0 refills | Status: AC

## 2024-06-12 NOTE — Progress Notes (Signed)
   Assessment & Plan:  Patient ID: Latoya Rivera, female    DOB: December 12, 2000  Age: 23 y.o. MRN: [id number]  Problem List Items Addressed This Visit   None    Follow-up: Return if symptoms worsen or fail to improve.    Subjective:    CC: There were no encounter diagnoses.  HPI Latoya Rivera presents for ADHD medication refills. She was dx with ADHD since early HS and does not take it every day will only take it about 3 days out of the week and with hard classes. Was not able to make into her provider at home during summer break to be seen and she just ran out. They will not refill it unless she comes in in person.No telemedicine. Per PMD review last pick up for 30 day rx was in April 2025. Denies any weight loss, palpitations, worsening mental health,  or sideffects.   History Yen has a past medical history of ADHD (attention deficit hyperactivity disorder) and Anxiety.   She has a past surgical history that includes Nose surgery; Wisdom tooth extraction (Bilateral); and Tonsillectomy.   Her family history is not on file.She reports that she has never smoked. She has been exposed to tobacco smoke. She has never used smokeless tobacco. She reports current alcohol use. She reports that she does not use drugs.  Outpatient Medications Prior to Visit  Medication Sig Dispense Refill   amphetamine-dextroamphetamine (ADDERALL) 5 MG tablet Take 1 tablet by mouth daily.     docusate sodium  (COLACE) 100 MG capsule Take 1 capsule (100 mg total) by mouth daily as needed. 30 capsule 2   escitalopram (LEXAPRO) 10 MG tablet Take 10 mg by mouth daily.     levonorgestrel (KYLEENA) 19.5 MG IUD 1 each by Intrauterine route once.     LORazepam (ATIVAN) 1 MG tablet Take 1 mg by mouth as needed.     melatonin 3 MG TABS tablet Take by mouth.     polyethylene glycol (MIRALAX ) 17 g packet Take 17 g by mouth daily. 14 each 0   linaclotide  (LINZESS ) 72 MCG capsule Take 1 capsule (72 mcg total) by mouth  daily before breakfast. 30 capsule 0   No facility-administered medications prior to visit.    ROS per HPI Review of Systems  Constitutional:  Negative for weight loss.  Cardiovascular:  Negative for palpitations.  All other systems reviewed and are negative.   Objective:  BP (!) 180/64   Pulse 72   Temp (!) 97 F (36.1 C) (Tympanic)   Wt 135 lb (61.2 kg)   SpO2 98%   BMI 22.47 kg/m   Physical Exam GEN: A&O x 3, NAD, well nourished HEENT: Normocephalic, atraumatic, EOMI RESP: CTAB, no R/R/W CV: RRR, no M/R/G ABDOMEN: soft, nontender, normal BS, no HSM VAS: no Darleth Eustache swelling NEURO: A&O x 3 MSK: normal gait, normal ROM UE and Rand Etchison PSYCH: normal speech, normal mood SKIN: no rashes or lesions   Corrion Stirewalt Phuong Adaleigh Warf, DO
